# Patient Record
Sex: Female | Born: 1975 | Race: Black or African American | Hispanic: No | Marital: Single | Smoking: Former smoker
Health system: Southern US, Community
[De-identification: ages and names within clinical notes are randomized; demographics above are authoritative.]

## PROBLEM LIST (undated history)

## (undated) DIAGNOSIS — D649 Anemia, unspecified: Secondary | ICD-10-CM

## (undated) DIAGNOSIS — E039 Hypothyroidism, unspecified: Secondary | ICD-10-CM

## (undated) DIAGNOSIS — M7989 Other specified soft tissue disorders: Secondary | ICD-10-CM

## (undated) DIAGNOSIS — R0600 Dyspnea, unspecified: Secondary | ICD-10-CM

## (undated) DIAGNOSIS — E041 Nontoxic single thyroid nodule: Secondary | ICD-10-CM

## (undated) HISTORY — PX: BIOPSY THYROID: PRO38

## (undated) HISTORY — DX: Nontoxic single thyroid nodule: E04.1

## (undated) HISTORY — DX: Hypothyroidism, unspecified: E03.9

---

## 2008-11-30 ENCOUNTER — Encounter (INDEPENDENT_AMBULATORY_CARE_PROVIDER_SITE_OTHER): Payer: Self-pay | Admitting: *Deleted

## 2009-06-14 ENCOUNTER — Ambulatory Visit (HOSPITAL_COMMUNITY): Admission: RE | Admit: 2009-06-14 | Discharge: 2009-06-14 | Payer: Self-pay | Admitting: Family Medicine

## 2010-05-04 ENCOUNTER — Emergency Department (HOSPITAL_COMMUNITY): Admission: EM | Admit: 2010-05-04 | Discharge: 2010-05-04 | Payer: Self-pay | Admitting: Emergency Medicine

## 2011-03-14 IMAGING — US US SOFT TISSUE HEAD/NECK
1 series · 14 of 25 positions shown · non-contrast
Comparison: None

CLINICAL DATA: Enlarged thyroid on physical exam

THYROID ULTRASOUND
TECHNIQUE: Ultrasound examination of the thyroid gland and
adjacent soft tissues was performed.

[Series 1: us soft tissue head/neck · 0.09mm/px · 14 of 67 slices shown]
[im 1/67]
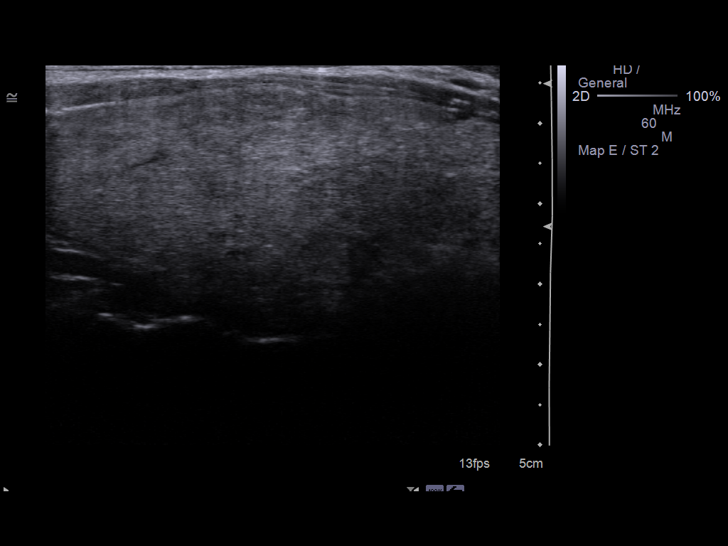
[im 6/67]
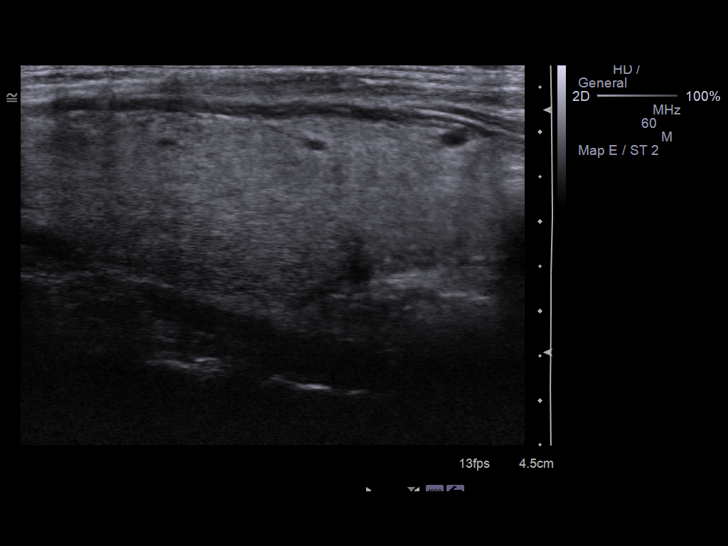
[im 12/67]
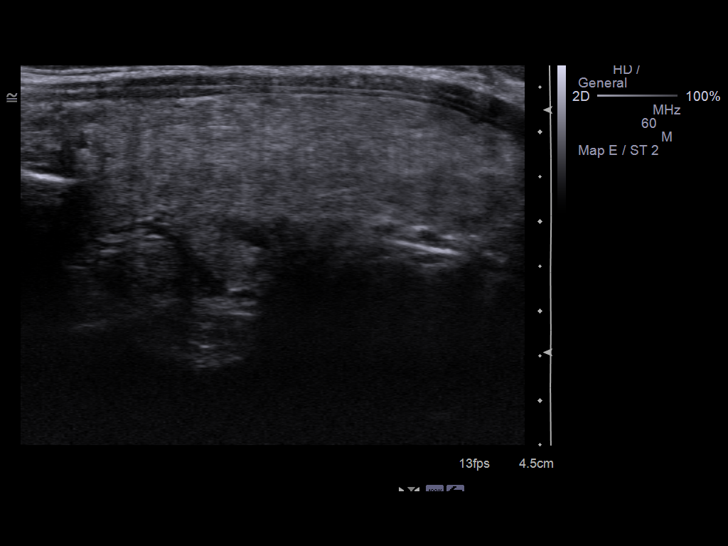
[im 17/67]
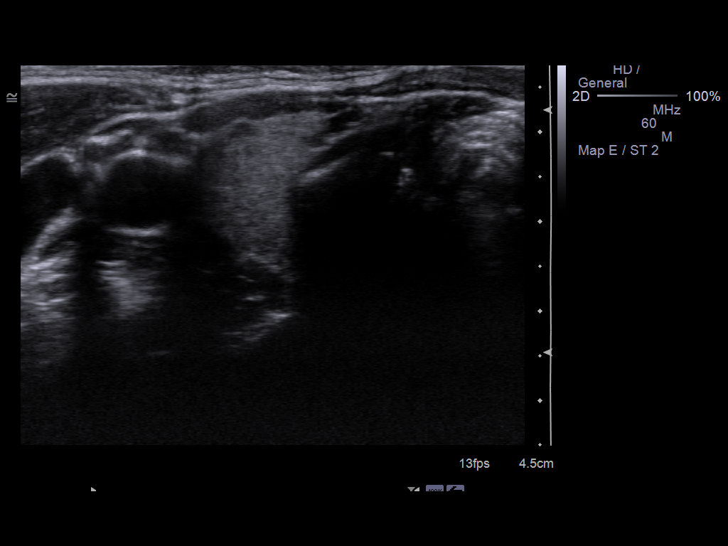
[im 23/67]
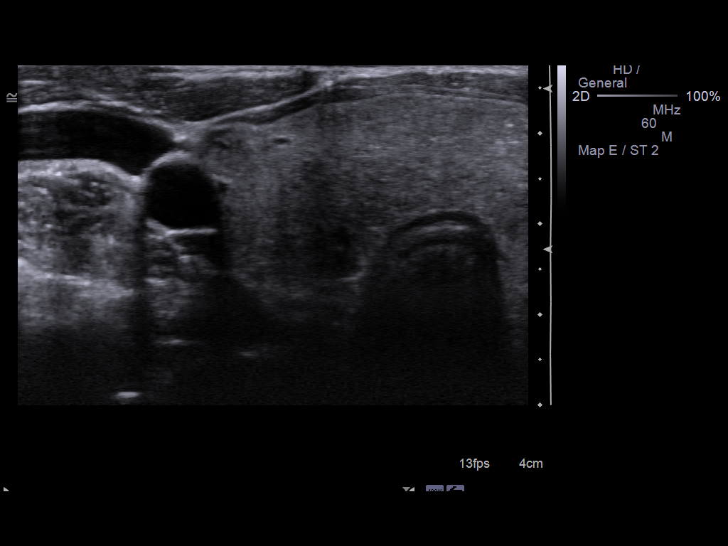
[im 25/67]
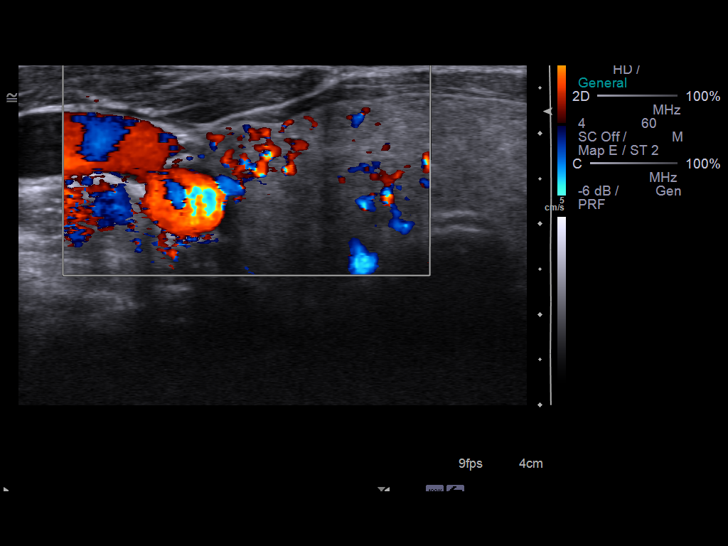
[im 31/67]
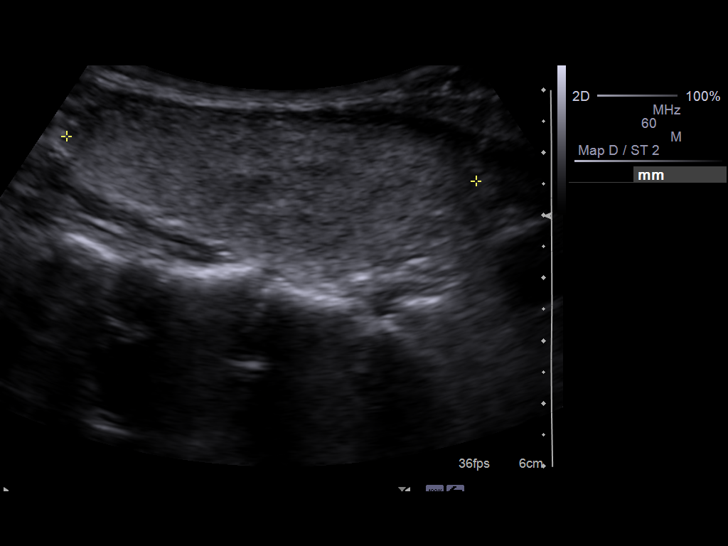
[im 36/67]
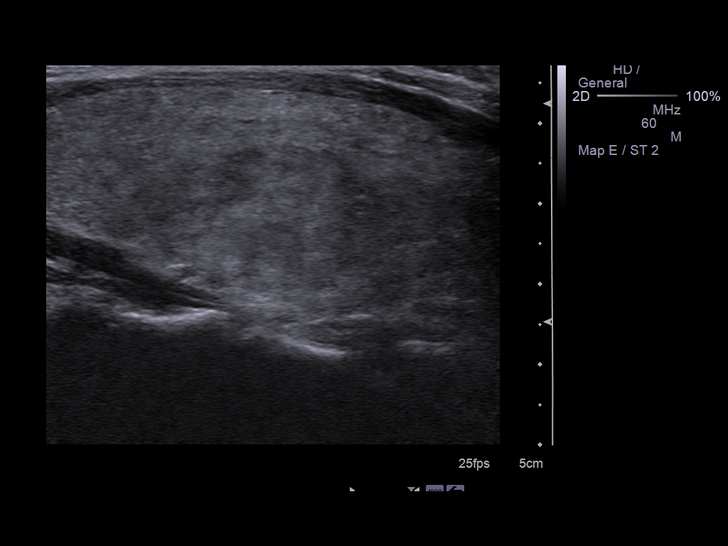
[im 42/67]
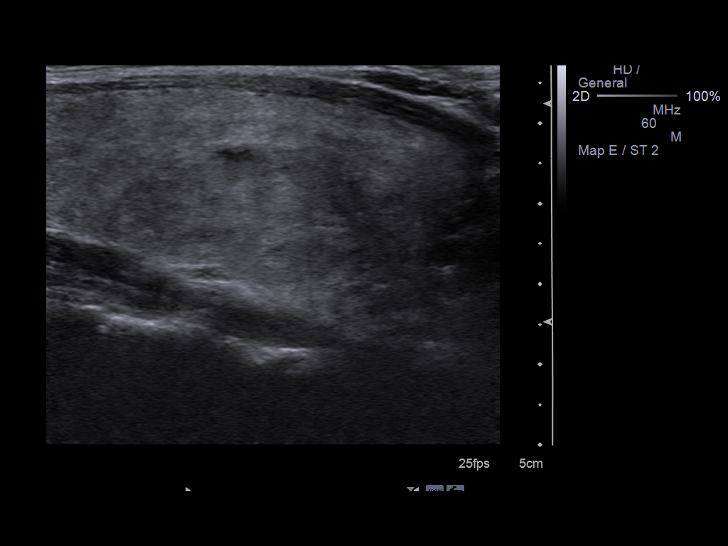
[im 45/67]
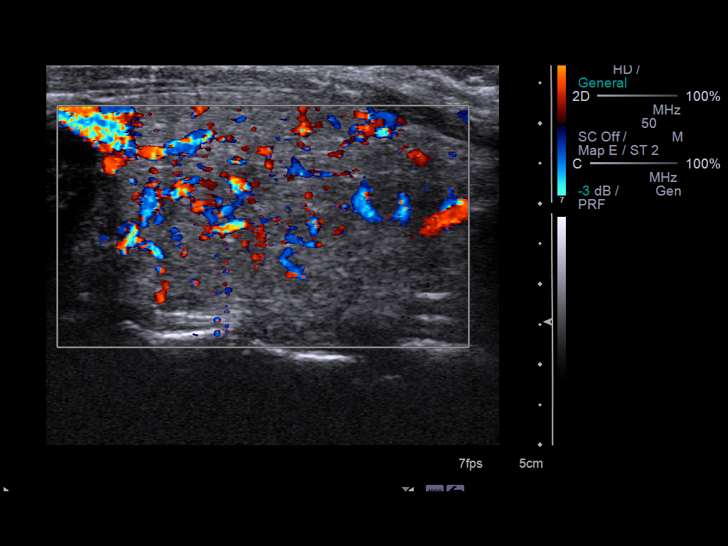
[im 50/67]
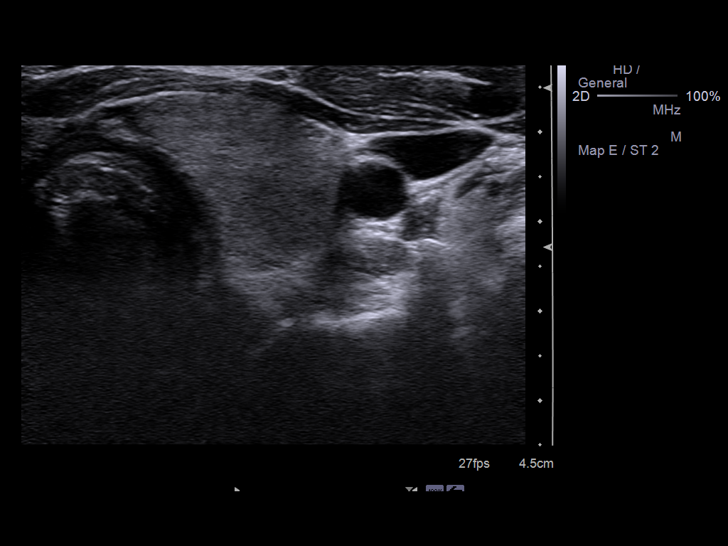
[im 56/67]
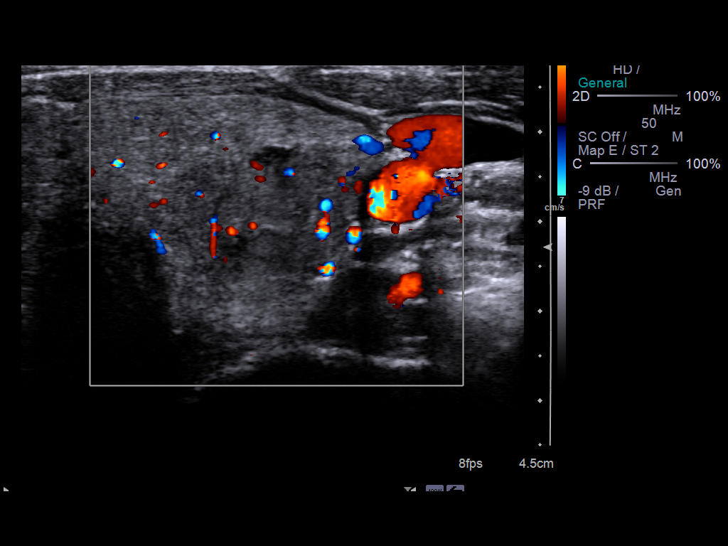
[im 61/67]
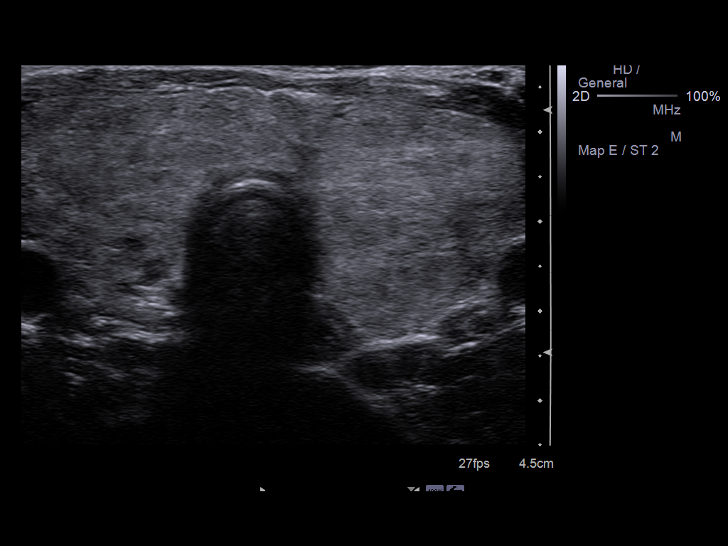
[im 67/67]
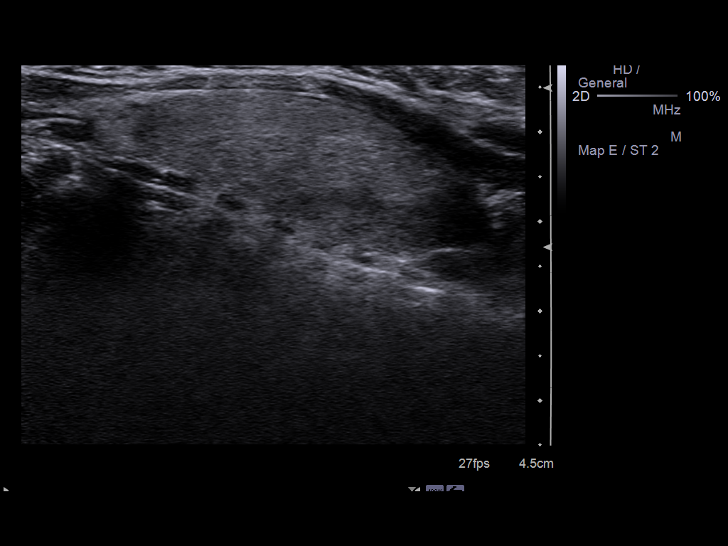

[14 of 25 positions shown; findings below may reference images not displayed]

FINDINGS: The thyroid gland is diffusely enlarged and
inhomogeneous.  The right lobe measures 6.5 cm sagittally, with a
depth of 2.6 cm and width of 2.7 cm.  The left lobe measures 6.6 x
2.7 x 2.7 cm, with the isthmus measuring 9 mm in thickness.

On the left there is a slightly hypoechoic indistinct nodule of
x 1.9 x 1.8 cm.  On the right there is a tiny peripheral nodule of
7 x 8 x 9 mm.  No other thyroid nodule is seen.
IMPRESSION: 1.  Diffusely enlarged and inhomogeneous thyroid.
2.  Indistinct slightly hypoechogenic nodule in the left lobe of
3.4 x 1.9 x 1.8 cm.  Consider biopsy of this dominant nodule.

## 2012-06-28 ENCOUNTER — Encounter: Payer: BC Managed Care – PPO | Admitting: Obstetrics and Gynecology

## 2012-07-13 ENCOUNTER — Encounter: Payer: BC Managed Care – PPO | Admitting: Obstetrics and Gynecology

## 2012-07-16 ENCOUNTER — Encounter: Payer: BC Managed Care – PPO | Admitting: Obstetrics and Gynecology

## 2015-05-25 ENCOUNTER — Encounter: Payer: Self-pay | Admitting: Physician Assistant

## 2015-06-08 ENCOUNTER — Encounter: Payer: Self-pay | Admitting: Gastroenterology

## 2015-06-11 ENCOUNTER — Ambulatory Visit: Payer: Self-pay | Admitting: Physician Assistant

## 2016-07-29 ENCOUNTER — Other Ambulatory Visit (HOSPITAL_COMMUNITY): Payer: Self-pay | Admitting: *Deleted

## 2016-07-30 ENCOUNTER — Ambulatory Visit (HOSPITAL_COMMUNITY)
Admission: RE | Admit: 2016-07-30 | Discharge: 2016-07-30 | Disposition: A | Discharge status: Home / Self Care | Payer: BLUE CROSS/BLUE SHIELD | Source: Ambulatory Visit | Attending: Obstetrics & Gynecology | Admitting: Obstetrics & Gynecology

## 2016-07-30 DIAGNOSIS — N920 Excessive and frequent menstruation with regular cycle: Secondary | ICD-10-CM | POA: Insufficient documentation

## 2016-07-30 DIAGNOSIS — D649 Anemia, unspecified: Secondary | ICD-10-CM | POA: Insufficient documentation

## 2016-07-30 MED ORDER — SODIUM CHLORIDE 0.9 % IV SOLN
510.0000 mg | INTRAVENOUS | Status: DC
  Administered 2016-07-30: 510 mg via INTRAVENOUS
  Filled 2016-07-30: qty 17

## 2016-07-30 NOTE — Discharge Instructions (Signed)
Ferumoxytol injection What is this medicine? FERUMOXYTOL is an iron complex. Iron is used to make healthy red blood cells, which carry oxygen and nutrients throughout the body. This medicine is used to treat iron deficiency anemia in people with chronic kidney disease. COMMON BRAND NAME(S): Feraheme What should I tell my health care provider before I take this medicine? They need to know if you have any of these conditions: -anemia not caused by low iron levels -high levels of iron in the blood -magnetic resonance imaging (MRI) test scheduled -an unusual or allergic reaction to iron, other medicines, foods, dyes, or preservatives -pregnant or trying to get pregnant -breast-feeding How should I use this medicine? This medicine is for injection into a vein. It is given by a health care professional in a hospital or clinic setting. Talk to your pediatrician regarding the use of this medicine in children. Special care may be needed. What if I miss a dose? It is important not to miss your dose. Call your doctor or health care professional if you are unable to keep an appointment. What may interact with this medicine? This medicine may interact with the following medications: -other iron products What should I watch for while using this medicine? Visit your doctor or healthcare professional regularly. Tell your doctor or healthcare professional if your symptoms do not start to get better or if they get worse. You may need blood work done while you are taking this medicine. You may need to follow a special diet. Talk to your doctor. Foods that contain iron include: whole grains/cereals, dried fruits, beans, or peas, leafy green vegetables, and organ meats (liver, kidney). What side effects may I notice from receiving this medicine? Side effects that you should report to your doctor or health care professional as soon as possible: -allergic reactions like skin rash, itching or hives, swelling of the  face, lips, or tongue -breathing problems -changes in blood pressure -feeling faint or lightheaded, falls -fever or chills -flushing, sweating, or hot feelings -swelling of the ankles or feet Side effects that usually do not require medical attention (report to your doctor or health care professional if they continue or are bothersome): -diarrhea -headache -nausea, vomiting -stomach pain Where should I keep my medicine? This drug is given in a hospital or clinic and will not be stored at home.  2017 Elsevier/Gold Standard (2015-07-26 12:41:49)  

## 2016-08-04 ENCOUNTER — Other Ambulatory Visit (HOSPITAL_COMMUNITY): Payer: Self-pay | Admitting: *Deleted

## 2016-08-05 ENCOUNTER — Encounter (HOSPITAL_COMMUNITY): Payer: BLUE CROSS/BLUE SHIELD

## 2016-08-07 ENCOUNTER — Encounter (HOSPITAL_COMMUNITY)
Admission: RE | Admit: 2016-08-07 | Discharge: 2016-08-07 | Disposition: A | Discharge status: Home / Self Care | Payer: BLUE CROSS/BLUE SHIELD | Source: Ambulatory Visit | Attending: Obstetrics & Gynecology | Admitting: Obstetrics & Gynecology

## 2016-08-07 DIAGNOSIS — D259 Leiomyoma of uterus, unspecified: Secondary | ICD-10-CM | POA: Diagnosis not present

## 2016-08-07 DIAGNOSIS — D649 Anemia, unspecified: Secondary | ICD-10-CM | POA: Insufficient documentation

## 2016-08-07 DIAGNOSIS — N920 Excessive and frequent menstruation with regular cycle: Secondary | ICD-10-CM | POA: Diagnosis not present

## 2016-08-07 MED ORDER — SODIUM CHLORIDE 0.9 % IV SOLN
510.0000 mg | INTRAVENOUS | Status: AC
  Administered 2016-08-07: 510 mg via INTRAVENOUS
  Filled 2016-08-07: qty 17

## 2016-10-14 ENCOUNTER — Inpatient Hospital Stay: Admit: 2016-10-14 | Payer: BLUE CROSS/BLUE SHIELD | Admitting: Obstetrics & Gynecology

## 2016-10-14 SURGERY — MYOMECTOMY, ABDOMINAL APPROACH
Anesthesia: Choice

## 2016-11-28 NOTE — Patient Instructions (Signed)
Your procedure is scheduled on:  Monday, December 08, 2016  Enter through the [employer] of [facility] at:  6:00 AM  Pick up the phone at the desk and dial [phone].  Call this number if you have problems the morning of surgery: [phone].  Remember: Do NOT eat food or drink after:  Midnight Sunday  Take these medicines the morning of surgery with a SIP OF WATER:  Synthroid  Stop ALL herbal medications at this time  Do NOT smoke the day of surgery.  Do NOT wear jewelry (body piercing), metal hair clips/bobby pins, make-up, or nail polish. Do NOT wear lotions, powders, or perfumes.  You may wear deodorant. Do NOT shave for 48 hours prior to surgery. Do NOT bring valuables to the hospital. Contacts, dentures, or bridgework may not be worn into surgery.  Leave suitcase in car.  After surgery it may be brought to your room.  For patients admitted to the hospital, checkout time is 11:00 AM the day of discharge.  Bring a copy of your healthcare power of attorney and living will documents.

## 2016-12-02 ENCOUNTER — Encounter (HOSPITAL_COMMUNITY): Payer: Self-pay

## 2016-12-02 ENCOUNTER — Encounter (HOSPITAL_COMMUNITY)
Admission: RE | Admit: 2016-12-02 | Discharge: 2016-12-02 | Disposition: A | Discharge status: Home / Self Care | Payer: BLUE CROSS/BLUE SHIELD | Source: Ambulatory Visit | Attending: Obstetrics & Gynecology | Admitting: Obstetrics & Gynecology

## 2016-12-02 DIAGNOSIS — Z01812 Encounter for preprocedural laboratory examination: Secondary | ICD-10-CM | POA: Diagnosis present

## 2016-12-02 DIAGNOSIS — Z01810 Encounter for preprocedural cardiovascular examination: Secondary | ICD-10-CM | POA: Insufficient documentation

## 2016-12-02 HISTORY — DX: Dyspnea, unspecified: R06.00

## 2016-12-02 HISTORY — DX: Anemia, unspecified: D64.9

## 2016-12-02 HISTORY — DX: Other specified soft tissue disorders: M79.89

## 2016-12-02 LAB — COMPREHENSIVE METABOLIC PANEL
ALK PHOS: 63 U/L (ref 38–126)
ALT: 11 U/L — AB (ref 14–54)
ANION GAP: 5 (ref 5–15)
AST: 13 U/L — ABNORMAL LOW (ref 15–41)
Albumin: 3.5 g/dL (ref 3.5–5.0)
BUN: 10 mg/dL (ref 6–20)
CALCIUM: 8.6 mg/dL — AB (ref 8.9–10.3)
CO2: 23 mmol/L (ref 22–32)
CREATININE: 0.77 mg/dL (ref 0.44–1.00)
Chloride: 110 mmol/L (ref 101–111)
Glucose, Bld: 96 mg/dL (ref 65–99)
Potassium: 3.5 mmol/L (ref 3.5–5.1)
SODIUM: 138 mmol/L (ref 135–145)
Total Bilirubin: 0.5 mg/dL (ref 0.3–1.2)
Total Protein: 6.9 g/dL (ref 6.5–8.1)

## 2016-12-02 LAB — CBC
HCT: 25.1 % — ABNORMAL LOW (ref 36.0–46.0)
Hemoglobin: 7.4 g/dL — ABNORMAL LOW (ref 12.0–15.0)
MCH: 25.4 pg — AB (ref 26.0–34.0)
MCHC: 29.5 g/dL — AB (ref 30.0–36.0)
MCV: 86.3 fL (ref 78.0–100.0)
PLATELETS: 277 10*3/uL (ref 150–400)
RBC: 2.91 MIL/uL — ABNORMAL LOW (ref 3.87–5.11)
RDW: 18.2 % — ABNORMAL HIGH (ref 11.5–15.5)
WBC: 5.8 10*3/uL (ref 4.0–10.5)

## 2016-12-02 LAB — ABO/RH: ABO/RH(D): O POS

## 2016-12-02 NOTE — Pre-Procedure Instructions (Signed)
Left a message for Heather at Dr. Lynnette Caffey' office about abnormal CBC.

## 2016-12-02 NOTE — Pre-Procedure Instructions (Signed)
Sandra Maxwell returned called, Sandra Maxwell will receive another iron infusion this week.  Dr. Lynnette Caffey requests CBC drawn day of surgery.

## 2016-12-03 ENCOUNTER — Other Ambulatory Visit (HOSPITAL_COMMUNITY): Payer: Self-pay | Admitting: *Deleted

## 2016-12-04 ENCOUNTER — Encounter (HOSPITAL_COMMUNITY)
Admission: RE | Admit: 2016-12-04 | Discharge: 2016-12-04 | Disposition: A | Discharge status: Home / Self Care | Payer: BLUE CROSS/BLUE SHIELD | Source: Ambulatory Visit | Attending: Obstetrics & Gynecology | Admitting: Obstetrics & Gynecology

## 2016-12-04 DIAGNOSIS — D500 Iron deficiency anemia secondary to blood loss (chronic): Secondary | ICD-10-CM | POA: Diagnosis not present

## 2016-12-04 DIAGNOSIS — D259 Leiomyoma of uterus, unspecified: Secondary | ICD-10-CM | POA: Diagnosis present

## 2016-12-04 MED ORDER — SODIUM CHLORIDE 0.9 % IV SOLN
510.0000 mg | INTRAVENOUS | Status: DC
  Administered 2016-12-04: 510 mg via INTRAVENOUS
  Filled 2016-12-04: qty 17

## 2016-12-07 ENCOUNTER — Encounter (HOSPITAL_COMMUNITY): Payer: Self-pay | Admitting: Anesthesiology

## 2016-12-07 NOTE — H&P (Signed)
Sandra Maxwell is an 41 y.o. female with enlarged, fibroid uterus causing symptoms; heavy and painful periods with anemia.  Ultrasound shows at least 3 fibroids; largest 8 cm.  She desires a fertility sparing surgical procedure.    Pertinent Gynecological History: Menses: flow is excessive with use of 10 pads or tampons on heaviest days Bleeding: regular Contraception: abstinence DES exposure: unknown Blood transfusions: none Sexually transmitted diseases: no past history Previous GYN Procedures: none  Last mammogram: normal Date: 2016 Last pap: normal Date: 10/2015 OB History: G0   Menstrual History: Menarche age: n/a Patient's last menstrual period was 11/09/2016 (exact date).    Past Medical History:  Diagnosis Date  . Anemia   . Dyspnea   . Hypothyroidism   . Leg swelling    bilateral  . Thyroid nodule, cold     Past Surgical History:  Procedure Laterality Date  . BIOPSY THYROID      History reviewed. No pertinent family history.  Social History:  reports that she quit smoking about 21 years ago. Her smoking use included Cigarettes. She has never used smokeless tobacco. She reports that she does not drink alcohol or use drugs.  Allergies:  Allergies  Allergen Reactions  . Shellfish-Derived Products Itching  . Methimazole Rash    No prescriptions prior to admission.    ROS  Last menstrual period 11/09/2016. Physical Exam  Constitutional: She is oriented to person, place, and time. She appears well-developed and well-nourished.  Cardiovascular: Normal rate and regular rhythm.   Respiratory: Effort normal and breath sounds normal.  GI: Soft. There is no rebound and no guarding.  Neurological: She is alert and oriented to person, place, and time.  Skin: Skin is warm and dry.    No results found for this or any previous visit (from the past 24 hour(s)).  No results found.  Assessment/Plan: 41yo G0 with symptomatic uterine leiomyomata -Abdominal  myomectomy The patient has been counseled re: risk of bleeding, infection, scarring and damage to surrounding structures.  She understands the likelihood of requiring a blood transfusion given her anemia and surgical blood loss.  She also understands the possible need to perform hysterectomy if unable to complete myomectomy safely.  The patient understands that in future pregnancies, she will likely require a C/S at 37 weeks.  All questions were answered and the patient wishes to proceed.  Dalonte Hardage, [place] 12/07/2016, 9:42 PM

## 2016-12-07 NOTE — Anesthesia Preprocedure Evaluation (Addendum)
Anesthesia Evaluation  Patient identified by MRN, date of birth, ID band Patient awake    Reviewed: Allergy & Precautions, NPO status , Patient's Chart, lab work & pertinent test results  Airway Mallampati: III       Dental  (+) Teeth Intact, Dental Advisory Given   Pulmonary neg pulmonary ROS, former smoker,    breath sounds clear to auscultation       Cardiovascular negative cardio ROS   Rhythm:Regular Rate:Normal     Neuro/Psych negative neurological ROS  negative psych ROS   GI/Hepatic negative GI ROS, Neg liver ROS,   Endo/Other  Hypothyroidism   Renal/GU negative Renal ROS  negative genitourinary   Musculoskeletal negative musculoskeletal ROS (+)   Abdominal   Peds negative pediatric ROS (+)  Hematology negative hematology ROS (+)   Anesthesia Other Findings Day of surgery medications reviewed with the patient.  Reproductive/Obstetrics negative OB ROS                            Anesthesia Physical Anesthesia Plan  ASA: II  Anesthesia Plan: General   Post-op Pain Management:    Induction: Intravenous  Airway Management Planned: Oral ETT  Additional Equipment:   Intra-op Plan:   Post-operative Plan: Extubation in OR  Informed Consent: I have reviewed the patients History and Physical, chart, labs and discussed the procedure including the risks, benefits and alternatives for the proposed anesthesia with the patient or authorized representative who has indicated his/her understanding and acceptance.   Dental advisory given  Plan Discussed with: CRNA  Anesthesia Plan Comments: (2nd IV after induction. )       Anesthesia Quick Evaluation

## 2016-12-08 ENCOUNTER — Encounter (HOSPITAL_COMMUNITY): Payer: Self-pay

## 2016-12-08 ENCOUNTER — Encounter (HOSPITAL_COMMUNITY)
Admission: RE | Disposition: A | Discharge status: Home / Self Care | Payer: Self-pay | Source: Ambulatory Visit | Attending: Obstetrics & Gynecology

## 2016-12-08 ENCOUNTER — Inpatient Hospital Stay (HOSPITAL_COMMUNITY): Payer: BLUE CROSS/BLUE SHIELD | Admitting: Anesthesiology

## 2016-12-08 ENCOUNTER — Observation Stay (HOSPITAL_COMMUNITY)
Admission: RE | Admit: 2016-12-08 | Discharge: 2016-12-09 | Disposition: A | Discharge status: Home / Self Care | Payer: BLUE CROSS/BLUE SHIELD | Source: Ambulatory Visit | Attending: Obstetrics & Gynecology | Admitting: Obstetrics & Gynecology

## 2016-12-08 DIAGNOSIS — Z9889 Other specified postprocedural states: Secondary | ICD-10-CM

## 2016-12-08 DIAGNOSIS — D259 Leiomyoma of uterus, unspecified: Secondary | ICD-10-CM | POA: Diagnosis not present

## 2016-12-08 DIAGNOSIS — E039 Hypothyroidism, unspecified: Secondary | ICD-10-CM | POA: Diagnosis not present

## 2016-12-08 DIAGNOSIS — Z87891 Personal history of nicotine dependence: Secondary | ICD-10-CM | POA: Insufficient documentation

## 2016-12-08 DIAGNOSIS — D649 Anemia, unspecified: Secondary | ICD-10-CM | POA: Insufficient documentation

## 2016-12-08 HISTORY — PX: MYOMECTOMY: SHX85

## 2016-12-08 LAB — CBC
HCT: 28.7 % — ABNORMAL LOW (ref 36.0–46.0)
Hemoglobin: 8.7 g/dL — ABNORMAL LOW (ref 12.0–15.0)
MCH: 26.7 pg (ref 26.0–34.0)
MCHC: 30.3 g/dL (ref 30.0–36.0)
MCV: 88 fL (ref 78.0–100.0)
PLATELETS: 288 10*3/uL (ref 150–400)
RBC: 3.26 MIL/uL — ABNORMAL LOW (ref 3.87–5.11)
RDW: 21.5 % — AB (ref 11.5–15.5)
WBC: 6.6 10*3/uL (ref 4.0–10.5)

## 2016-12-08 LAB — HEMOGLOBIN AND HEMATOCRIT, BLOOD
HEMATOCRIT: 27.9 % — AB (ref 36.0–46.0)
HEMOGLOBIN: 9 g/dL — AB (ref 12.0–15.0)

## 2016-12-08 LAB — PREGNANCY, URINE: Preg Test, Ur: NEGATIVE

## 2016-12-08 LAB — PREPARE RBC (CROSSMATCH)

## 2016-12-08 SURGERY — MYOMECTOMY, ABDOMINAL APPROACH
Anesthesia: General | Site: Abdomen

## 2016-12-08 MED ORDER — SUGAMMADEX SODIUM 200 MG/2ML IV SOLN
INTRAVENOUS | Status: AC
  Filled 2016-12-08: qty 2

## 2016-12-08 MED ORDER — VASOPRESSIN 20 UNIT/ML IV SOLN
INTRAVENOUS | Status: AC
  Filled 2016-12-08: qty 1

## 2016-12-08 MED ORDER — HYDROMORPHONE HCL 1 MG/ML IJ SOLN
INTRAMUSCULAR | Status: AC
  Administered 2016-12-08: 0.5 mg via INTRAVENOUS
  Filled 2016-12-08: qty 1

## 2016-12-08 MED ORDER — MENTHOL 3 MG MT LOZG: 1.0000 | LOZENGE | OROMUCOSAL | Status: DC | PRN

## 2016-12-08 MED ORDER — PROPOFOL 10 MG/ML IV BOLUS
INTRAVENOUS | Status: AC
Start: 2016-12-08 — End: 2016-12-08
  Filled 2016-12-08: qty 20

## 2016-12-08 MED ORDER — HYDROMORPHONE HCL 1 MG/ML IJ SOLN
0.2500 mg | INTRAMUSCULAR | Status: DC | PRN
  Administered 2016-12-08 (×2): 0.25 mg via INTRAVENOUS
  Administered 2016-12-08 (×3): 0.5 mg via INTRAVENOUS

## 2016-12-08 MED ORDER — LACTATED RINGERS IV SOLN
INTRAVENOUS | Status: DC
  Administered 2016-12-08 (×2): via INTRAVENOUS

## 2016-12-08 MED ORDER — FENTANYL CITRATE (PF) 250 MCG/5ML IJ SOLN
INTRAMUSCULAR | Status: AC
  Filled 2016-12-08: qty 5

## 2016-12-08 MED ORDER — DOCUSATE SODIUM 100 MG PO CAPS
100.0000 mg | ORAL_CAPSULE | Freq: Two times a day (BID) | ORAL | Status: DC
  Administered 2016-12-08 – 2016-12-09 (×2): 100 mg via ORAL
  Filled 2016-12-08 (×2): qty 1

## 2016-12-08 MED ORDER — KETOROLAC TROMETHAMINE 30 MG/ML IJ SOLN
30.0000 mg | Freq: Four times a day (QID) | INTRAMUSCULAR | Status: DC
  Administered 2016-12-08 – 2016-12-09 (×3): 30 mg via INTRAVENOUS
  Filled 2016-12-08 (×3): qty 1

## 2016-12-08 MED ORDER — DEXAMETHASONE SODIUM PHOSPHATE 10 MG/ML IJ SOLN
INTRAMUSCULAR | Status: DC | PRN
  Administered 2016-12-08: 4 mg via INTRAVENOUS

## 2016-12-08 MED ORDER — SODIUM CHLORIDE 0.9 % IV SOLN
Freq: Once | INTRAVENOUS | Status: AC
  Administered 2016-12-08: 08:00:00 via INTRAVENOUS

## 2016-12-08 MED ORDER — 0.9 % SODIUM CHLORIDE (POUR BTL) OPTIME
TOPICAL | Status: DC | PRN
  Administered 2016-12-08: 2000 mL

## 2016-12-08 MED ORDER — DEXAMETHASONE SODIUM PHOSPHATE 4 MG/ML IJ SOLN
INTRAMUSCULAR | Status: AC
  Filled 2016-12-08: qty 1

## 2016-12-08 MED ORDER — PROPOFOL 10 MG/ML IV BOLUS
INTRAVENOUS | Status: DC | PRN
  Administered 2016-12-08: 200 mg via INTRAVENOUS

## 2016-12-08 MED ORDER — ACETAMINOPHEN 10 MG/ML IV SOLN
1000.0000 mg | Freq: Once | INTRAVENOUS | Status: AC
  Administered 2016-12-08: 1000 mg via INTRAVENOUS

## 2016-12-08 MED ORDER — FENTANYL CITRATE (PF) 100 MCG/2ML IJ SOLN
INTRAMUSCULAR | Status: DC | PRN
  Administered 2016-12-08 (×2): 100 ug via INTRAVENOUS
  Administered 2016-12-08 (×3): 50 ug via INTRAVENOUS

## 2016-12-08 MED ORDER — PROMETHAZINE HCL 25 MG/ML IJ SOLN: 6.2500 mg | INTRAMUSCULAR | Status: DC | PRN

## 2016-12-08 MED ORDER — SODIUM CHLORIDE 0.9 % IJ SOLN
INTRAMUSCULAR | Status: AC
  Filled 2016-12-08: qty 100

## 2016-12-08 MED ORDER — KETOROLAC TROMETHAMINE 30 MG/ML IJ SOLN
INTRAMUSCULAR | Status: AC
  Filled 2016-12-08: qty 1

## 2016-12-08 MED ORDER — KETOROLAC TROMETHAMINE 30 MG/ML IJ SOLN
30.0000 mg | Freq: Four times a day (QID) | INTRAMUSCULAR | Status: DC
  Filled 2016-12-08: qty 1

## 2016-12-08 MED ORDER — LIDOCAINE HCL (CARDIAC) 20 MG/ML IV SOLN
INTRAVENOUS | Status: DC | PRN
  Administered 2016-12-08: 100 mg via INTRAVENOUS

## 2016-12-08 MED ORDER — LEVOTHYROXINE SODIUM 150 MCG PO TABS
150.0000 ug | ORAL_TABLET | Freq: Every day | ORAL | Status: DC
  Administered 2016-12-09: 150 ug via ORAL
  Filled 2016-12-08 (×2): qty 1

## 2016-12-08 MED ORDER — LACTATED RINGERS IV SOLN: INTRAVENOUS | Status: DC

## 2016-12-08 MED ORDER — ONDANSETRON HCL 4 MG/2ML IJ SOLN: 4.0000 mg | Freq: Four times a day (QID) | INTRAMUSCULAR | Status: DC | PRN

## 2016-12-08 MED ORDER — VASOPRESSIN 20 UNIT/ML IJ SOLN
INTRAMUSCULAR | Status: DC | PRN
  Administered 2016-12-08: 20 [IU]

## 2016-12-08 MED ORDER — MIDAZOLAM HCL 2 MG/2ML IJ SOLN
INTRAMUSCULAR | Status: AC
  Filled 2016-12-08: qty 2

## 2016-12-08 MED ORDER — SCOPOLAMINE 1 MG/3DAYS TD PT72
MEDICATED_PATCH | TRANSDERMAL | Status: AC
  Administered 2016-12-08: 1.5 mg via TRANSDERMAL
  Filled 2016-12-08: qty 1

## 2016-12-08 MED ORDER — ROCURONIUM BROMIDE 100 MG/10ML IV SOLN
INTRAVENOUS | Status: AC
  Filled 2016-12-08: qty 1

## 2016-12-08 MED ORDER — ROCURONIUM BROMIDE 100 MG/10ML IV SOLN
INTRAVENOUS | Status: DC | PRN
  Administered 2016-12-08: 50 mg via INTRAVENOUS

## 2016-12-08 MED ORDER — MEPERIDINE HCL 25 MG/ML IJ SOLN: 6.2500 mg | INTRAMUSCULAR | Status: DC | PRN

## 2016-12-08 MED ORDER — ACETAMINOPHEN 10 MG/ML IV SOLN
INTRAVENOUS | Status: AC
  Filled 2016-12-08: qty 100

## 2016-12-08 MED ORDER — KETOROLAC TROMETHAMINE 30 MG/ML IJ SOLN
INTRAMUSCULAR | Status: DC | PRN
  Administered 2016-12-08: 30 mg via INTRAVENOUS

## 2016-12-08 MED ORDER — SIMETHICONE 80 MG PO CHEW: 80.0000 mg | CHEWABLE_TABLET | Freq: Four times a day (QID) | ORAL | Status: DC | PRN

## 2016-12-08 MED ORDER — OXYCODONE-ACETAMINOPHEN 5-325 MG PO TABS
1.0000 | ORAL_TABLET | ORAL | Status: DC | PRN
  Administered 2016-12-09 (×2): 1 via ORAL
  Filled 2016-12-08 (×2): qty 1

## 2016-12-08 MED ORDER — SCOPOLAMINE 1 MG/3DAYS TD PT72
1.0000 | MEDICATED_PATCH | Freq: Once | TRANSDERMAL | Status: DC
  Administered 2016-12-08: 1.5 mg via TRANSDERMAL

## 2016-12-08 MED ORDER — HYDROMORPHONE HCL 1 MG/ML IJ SOLN
0.2000 mg | INTRAMUSCULAR | Status: DC | PRN
  Administered 2016-12-08 (×2): 0.6 mg via INTRAVENOUS
  Filled 2016-12-08 (×2): qty 1

## 2016-12-08 MED ORDER — LACTATED RINGERS IV SOLN
INTRAVENOUS | Status: DC
  Administered 2016-12-08 (×3): via INTRAVENOUS

## 2016-12-08 MED ORDER — MIDAZOLAM HCL 2 MG/2ML IJ SOLN
INTRAMUSCULAR | Status: DC | PRN
  Administered 2016-12-08: 2 mg via INTRAVENOUS

## 2016-12-08 MED ORDER — ONDANSETRON HCL 4 MG/2ML IJ SOLN
INTRAMUSCULAR | Status: DC | PRN
  Administered 2016-12-08: 4 mg via INTRAVENOUS

## 2016-12-08 MED ORDER — LIDOCAINE HCL 1 % IJ SOLN
INTRAMUSCULAR | Status: AC
  Filled 2016-12-08: qty 10

## 2016-12-08 MED ORDER — ONDANSETRON HCL 4 MG PO TABS: 4.0000 mg | ORAL_TABLET | Freq: Four times a day (QID) | ORAL | Status: DC | PRN

## 2016-12-08 MED ORDER — SUGAMMADEX SODIUM 200 MG/2ML IV SOLN
INTRAVENOUS | Status: DC | PRN
  Administered 2016-12-08: 197.8 mg via INTRAVENOUS

## 2016-12-08 MED ORDER — HYDROMORPHONE HCL 1 MG/ML IJ SOLN
INTRAMUSCULAR | Status: AC
  Administered 2016-12-08: 0.25 mg via INTRAVENOUS
  Filled 2016-12-08: qty 1

## 2016-12-08 MED ORDER — ONDANSETRON HCL 4 MG/2ML IJ SOLN
INTRAMUSCULAR | Status: AC
  Filled 2016-12-08: qty 2

## 2016-12-08 MED ORDER — LIDOCAINE HCL (CARDIAC) 20 MG/ML IV SOLN
INTRAVENOUS | Status: AC
  Filled 2016-12-08: qty 5

## 2016-12-08 MED ORDER — CEFAZOLIN SODIUM-DEXTROSE 2-4 GM/100ML-% IV SOLN
2.0000 g | INTRAVENOUS | Status: AC
  Administered 2016-12-08: 2 g via INTRAVENOUS

## 2016-12-08 SURGICAL SUPPLY — 35 items
BARRIER ADHS 3X4 INTERCEED (GAUZE/BANDAGES/DRESSINGS) IMPLANT
BENZOIN TINCTURE PRP APPL 2/3 (GAUZE/BANDAGES/DRESSINGS) ×3 IMPLANT
CANISTER SUCT 3000ML PPV (MISCELLANEOUS) ×3 IMPLANT
CLOSURE WOUND 1/2 X4 (GAUZE/BANDAGES/DRESSINGS) ×1
CLOTH BEACON ORANGE TIMEOUT ST (SAFETY) ×3 IMPLANT
CONT PATH 16OZ SNAP LID 3702 (MISCELLANEOUS) ×3 IMPLANT
DRAPE CESAREAN BIRTH W POUCH (DRAPES) ×3 IMPLANT
DURAPREP 26ML APPLICATOR (WOUND CARE) ×6 IMPLANT
GLOVE BIO SURGEON STRL SZ 6 (GLOVE) ×3 IMPLANT
GLOVE BIOGEL PI IND STRL 6 (GLOVE) ×2 IMPLANT
GLOVE BIOGEL PI IND STRL 7.0 (GLOVE) ×1 IMPLANT
GLOVE BIOGEL PI INDICATOR 6 (GLOVE) ×4
GLOVE BIOGEL PI INDICATOR 7.0 (GLOVE) ×2
GOWN STRL REUS W/TWL LRG LVL3 (GOWN DISPOSABLE) ×9 IMPLANT
NS IRRIG 1000ML POUR BTL (IV SOLUTION) ×3 IMPLANT
PACK ABDOMINAL GYN (CUSTOM PROCEDURE TRAY) ×3 IMPLANT
PAD OB MATERNITY 4.3X12.25 (PERSONAL CARE ITEMS) ×3 IMPLANT
PENCIL SMOKE EVAC W/HOLSTER (ELECTROSURGICAL) IMPLANT
PROTECTOR NERVE ULNAR (MISCELLANEOUS) ×3 IMPLANT
SPONGE LAP 18X18 X RAY DECT (DISPOSABLE) ×3 IMPLANT
STRIP CLOSURE SKIN 1/2X4 (GAUZE/BANDAGES/DRESSINGS) ×2 IMPLANT
SUT MON AB 2-0 CT1 36 (SUTURE) ×3 IMPLANT
SUT PDS AB 0 CT1 27 (SUTURE) IMPLANT
SUT PDS AB 3-0 SH 27 (SUTURE) ×9 IMPLANT
SUT PLAIN 2 0 (SUTURE) ×2
SUT PLAIN 2 0 XLH (SUTURE) IMPLANT
SUT PLAIN ABS 2-0 CT1 27XMFL (SUTURE) ×1 IMPLANT
SUT VIC AB 0 CT1 18XCR BRD8 (SUTURE) ×3 IMPLANT
SUT VIC AB 0 CT1 36 (SUTURE) ×3 IMPLANT
SUT VIC AB 0 CT1 8-18 (SUTURE) ×6
SUT VIC AB 2-0 CT1 (SUTURE) IMPLANT
SUT VIC AB 4-0 KS 27 (SUTURE) ×3 IMPLANT
SUT VICRYL 0 TIES 12 18 (SUTURE) ×3 IMPLANT
TOWEL OR 17X24 6PK STRL BLUE (TOWEL DISPOSABLE) ×6 IMPLANT
TRAY FOLEY CATH SILVER 14FR (SET/KITS/TRAYS/PACK) ×3 IMPLANT

## 2016-12-08 NOTE — Progress Notes (Signed)
No change in H&P. Hgb this AM 8.7.  Will T&C x 2 u PRBCs.  Linda Hedges, DO

## 2016-12-08 NOTE — Anesthesia Postprocedure Evaluation (Signed)
Anesthesia Post Note  Patient: Thea Silversmith  Procedure(s) Performed: Procedure(s) (LRB): MYOMECTOMY, ABDOMINAL (N/A)     Patient location during evaluation: PACU Anesthesia Type: General Level of consciousness: awake and alert Pain management: pain level controlled Vital Signs Assessment: post-procedure vital signs reviewed and stable Respiratory status: spontaneous breathing, nonlabored ventilation, respiratory function stable and patient connected to nasal cannula oxygen Cardiovascular status: blood pressure returned to baseline and stable Postop Assessment: no signs of nausea or vomiting Anesthetic complications: no    Last Vitals:  Vitals:   12/08/16 1145 12/08/16 1611  BP: 116/73 115/64  Pulse: 75 62  Resp: 16 18  Temp: 36.8 C 36.6 C    Last Pain:  Vitals:   12/08/16 1611  TempSrc: Oral  PainSc:    Pain Goal: Patients Stated Pain Goal: 5 (12/08/16 1136)               Effie Berkshire

## 2016-12-08 NOTE — Transfer of Care (Signed)
Immediate Anesthesia Transfer of Care Note  Patient: Sandra Maxwell  Procedure(s) Performed: Procedure(s): MYOMECTOMY, ABDOMINAL (N/A)  Patient Location: PACU  Anesthesia Type:General  Level of Consciousness: awake, alert , oriented and patient cooperative  Airway & Oxygen Therapy: Patient Spontanous Breathing and Patient connected to nasal cannula oxygen  Post-op Assessment: Report given to RN and Post -op Vital signs reviewed and stable  Post vital signs: Reviewed and stable 98.9 oral temp, 129/88, NSR 81, spo2 on 2L [place] 100%  Last Vitals:  Vitals:   12/08/16 0610  BP: (!) 128/92  Pulse: 100  Resp: 20  Temp: 36.8 C    Last Pain:  Vitals:   12/08/16 0610  TempSrc: Oral      Patients Stated Pain Goal: 5 (09/04/29 4388)  Complications: No apparent anesthesia complications

## 2016-12-08 NOTE — Progress Notes (Addendum)
NOS  No current c/o.  Tolerating clears.  No CP/SOB.  Pain and nausea well controlled.  VSS. AF. Hgb at noon 9.0 (s/p 2 u PRBCs) UOP adequate and clear  Gen: A&O x 3 Abd: soft, ND, dressing with quarter sized stain Ext: no c/c/e  POD#0 s/p abdominal myomectomy -Acute on chronic anemia-s/p 2uPRBCs -Continue pain and nausea control -AM labs pending -D/C foley and IVF tomorrow -Ambulate -K pad  Linda Hedges, DO

## 2016-12-08 NOTE — Anesthesia Procedure Notes (Signed)
Procedure Name: Intubation Date/Time: 12/08/2016 7:25 AM Performed by: Hewitt Blade Pre-anesthesia Checklist: Patient identified, Emergency Drugs available, Suction available and Patient being monitored Patient Re-evaluated:Patient Re-evaluated prior to inductionOxygen Delivery Method: Circle system utilized Preoxygenation: Pre-oxygenation with 100% oxygen Intubation Type: IV induction Ventilation: Mask ventilation without difficulty Laryngoscope Size: Mac and 3 Grade View: Grade I Tube type: Oral Tube size: 7.0 mm Number of attempts: 1 Airway Equipment and Method: Stylet Placement Confirmation: ETT inserted through vocal cords under direct vision,  positive ETCO2 and breath sounds checked- equal and bilateral Secured at: 21 cm Tube secured with: Tape Dental Injury: Teeth and Oropharynx as per pre-operative assessment

## 2016-12-09 ENCOUNTER — Encounter (HOSPITAL_COMMUNITY): Payer: Self-pay | Admitting: Obstetrics & Gynecology

## 2016-12-09 DIAGNOSIS — D259 Leiomyoma of uterus, unspecified: Secondary | ICD-10-CM | POA: Diagnosis not present

## 2016-12-09 LAB — TYPE AND SCREEN
ABO/RH(D): O POS
ANTIBODY SCREEN: NEGATIVE
UNIT DIVISION: 0
Unit division: 0

## 2016-12-09 LAB — BPAM RBC
BLOOD PRODUCT EXPIRATION DATE: 201806202359
Blood Product Expiration Date: 201806252359
ISSUE DATE / TIME: 201806040742
ISSUE DATE / TIME: 201806040742
UNIT TYPE AND RH: 5100
Unit Type and Rh: 5100

## 2016-12-09 LAB — CBC
HCT: 22.7 % — ABNORMAL LOW (ref 36.0–46.0)
Hemoglobin: 7.2 g/dL — ABNORMAL LOW (ref 12.0–15.0)
MCH: 28.3 pg (ref 26.0–34.0)
MCHC: 31.7 g/dL (ref 30.0–36.0)
MCV: 89.4 fL (ref 78.0–100.0)
PLATELETS: 201 10*3/uL (ref 150–400)
RBC: 2.54 MIL/uL — AB (ref 3.87–5.11)
RDW: 21.5 % — AB (ref 11.5–15.5)
WBC: 10.4 10*3/uL (ref 4.0–10.5)

## 2016-12-09 MED ORDER — OXYCODONE-ACETAMINOPHEN 5-325 MG PO TABS: 1.0000 | ORAL_TABLET | ORAL | 0 refills | Status: AC | PRN

## 2016-12-09 MED ORDER — IBUPROFEN 800 MG PO TABS: 800.0000 mg | ORAL_TABLET | Freq: Four times a day (QID) | ORAL | 1 refills | Status: AC | PRN

## 2016-12-09 NOTE — Discharge Instructions (Signed)
Call MD for T>100.4, heavy vaginal bleeding, severe abdominal pain, intractable nausea and/or vomiting, or respiratory distress.  Call office to schedule postop appointment in 1-2 weeks.  No driving while taking narcotics.  Pelvic rest x 6 weeks. °

## 2016-12-09 NOTE — Progress Notes (Signed)
No current c/o.  Voiding well.  Large BM today.  Wants d/c home.   Patient is meeting all goals.  Linda Hedges, DO

## 2016-12-09 NOTE — Progress Notes (Signed)
Subjective: Patient reports tolerating PO and + flatus. Ambulating well.  No void since catheter removed.  No N/V.  Pain well controlled.   Objective: I have reviewed patient's vital signs, intake and output, medications and labs.  General: alert, cooperative and appears stated age GI: incision: clean and dry and approp ttp Extremities: extremities normal, atraumatic, no cyanosis or edema   Assessment/Plan: POD#1 Doing well; meeting goals Will reassess after lunch to see if voiding without difficulty and consider d/c this pm vs tomorrow am  LOS: 1 day    Madge Therrien 12/09/2016, 8:35 AM

## 2016-12-09 NOTE — Op Note (Signed)
Sandra Maxwell PROCEDURE DATE: 12/08/2016  PREOPERATIVE DIAGNOSIS:  Symptomatic fibroids POSTOPERATIVE DIAGNOSIS:  Symptomatic fibroids SURGEON:   Dr. Linda Hedges ASSISTANT:   Dr. Arvella Nigh OPERATION:  Abdominal myomectomy ANESTHESIA:  General endotracheal.  INDICATIONS: The patient is a 41 y.o. G0 with history of symptomatic uterine fibroids specifically heavy menstrual bleeding to anemia. The patient made a decision to undergo definite surgical treatment. On the preoperative visit, the risks, benefits, indications, and alternatives of the procedure were reviewed with the patient.  On the day of surgery, the risks of surgery were again discussed with the patient including but not limited to: bleeding which may require transfusion or reoperation; infection which may require antibiotics; injury to bowel, bladder, ureters or other surrounding organs; need for additional procedures; thromboembolic phenomenon, incisional problems and other postoperative/anesthesia complications. Written informed consent was obtained.    OPERATIVE FINDINGS: An 18 week sized globally enlarged uterus with normal tubes and ovaries bilaterally. Uterine cavity was entered with removal of the largest fibroid and the patient will require cesarean if she should become pregnant.  ESTIMATED BLOOD LOSS: 1300 ml SPECIMENS:  Leiomyomata sent to pathology COMPLICATIONS:  None immediate.   DESCRIPTION OF PROCEDURE: The patient received intravenous antibiotics and had sequential compression devices applied to her lower extremities while in the preoperative area.   She was taken to the operating room and placed under general anesthesia without difficulty and found to be adequate.The abdomen and perineum were prepped and draped in a sterile manner, and a Foley catheter was inserted into the bladder and attached to constant drainage. After an adequate timeout was performed, a Pfannensteil skin incision was made. This incision was taken  down to the fascia using electrocautery with care given to maintain good hemostasis. The fascia was incised in the midline and the fascial incision was then extended bilaterally using electrocautery without difficulty. The fascia was then dissected off the underlying rectus muscles using blunt and sharp dissection. The rectus muscles were split bluntly in the midline and the peritoneum entered sharply without complication. This peritoneal incision was then extended superiorly and inferiorly with care given to prevent bowel or bladder injury. Upon entry into the abdominal cavity, the upper abdomen was inspected and found to be normal. Attention was then turned to the pelvis. The  Uterus was elevated out of the pelvic.  The uterus was globally enlarged with central firmness suggesting a deeply located, large fibroid.  A single subserosal fibroid was noted posterior, right.  A small incision was made with the Bovie knife and this approximately 2 cm fibroid was removed.  A single layer of deep 2-0 vicryl figure of eight stitches was used to close muscular layer and then 3-0 monocryl was used to close the serosa in a running fashion. Attention was turned to removal of the largest centrally located fibroid.  Bovie knife was used make anterior to posterior incision at the uterine fundus.  At this point, brisk bleeding was noted and given the patient's preoperative hemoglobin (8.7) and the expectation for additional bleeding, the decision was made to transfuse the 2u PRBCs which were already in the OR.  The large fibroid was located, dissected free from surrounding tissues and removed; measuring approximately 8 cm and 50% intracavitary.  Following removal, attempt was made to reapproximate endometrium in the deepest layer but because of the extensive involvement with the fibroid, complete endometrial repair was not possible.  The myometrium was closed in four layers using 2-0 vicryl figure of eight stitches in the  deep  layers and 3-0 monocryl in a running locked stitch superficially.  There was one additional palpable fibroid in posterior uterus just above the level of the uterosacrals and midline.  This area was vertically incised and the bilobed fibroid removed.  Two layers of deep 2-0 Vcryl figure of eight stitches and a superficial 3-0 Monocryl running locked layer achieved hemostasis.  The uterus was returned to the pelvis. The pelvis was irrigated and hemostasis was reconfirmed. Arista was applied to the large fundal incision to encourage continued hemostasis.  The peritoneum was closed with 2-0 monocryl, and the fascia was closed with 0 Vicryl in a running fashion. The subcutaneous layer was reapproximated with 2-0 plain gut. The skin was closed with a 4-0 Monocryl subcuticular stitch. Sponge, lap, needle, and instrument counts were correct times two. The patient was taken to the recovery area awake, extubated and in stable condition.

## 2016-12-09 NOTE — Discharge Summary (Signed)
Physician Discharge Summary  Patient ID: Sandra Maxwell MRN: [id number] DOB/AGE: [birth year] 41 y.o.  Admit date: 12/08/2016 Discharge date: 12/09/2016  Admission Diagnoses: Symptomatic uterine leiomyomata  Discharge Diagnoses: SAA Active Problems:   S/P myomectomy   Discharged Condition: good  Hospital Course: The patient was admitted for anticipated abdominal myomectomy which was performed without complication.  On POD#1 she was meeting all goals and was discharged home with planned office follow up.  Consults: None  Significant Diagnostic Studies: labs: Hgb 7.2  Treatments: surgery: abdominal myomectomy  Discharge Exam: Blood pressure 120/70, pulse 72, temperature 98.7 F (37.1 C), temperature source Oral, resp. rate 16, last menstrual period 11/09/2016, SpO2 100 %. General appearance: alert, cooperative and appears stated age GI: approp ttp Extremities: extremities normal, atraumatic, no cyanosis or edema Incision/Wound: c/d/i   Disposition: Final discharge disposition not confirmed   Allergies as of 12/09/2016      Reactions   Shellfish-derived Products Itching   Methimazole Rash      Medication List    TAKE these medications   ferrous sulfate 325 (65 FE) MG tablet Take 325 mg by mouth 3 (three) times daily with meals.   ibuprofen 800 MG tablet Commonly known as:  ADVIL Take 1 tablet (800 mg total) by mouth every 6 (six) hours as needed. What changed:  medication strength  when to take this  reasons to take this   oxyCODONE-acetaminophen 5-325 MG tablet Commonly known as:  PERCOCET/ROXICET Take 1-2 tablets by mouth every 4 (four) hours as needed (moderate to severe pain (when tolerating fluids)).   SYNTHROID 150 MCG tablet Generic drug:  levothyroxine Take 150 mcg by mouth daily before breakfast.        Signed: Treyce Spillers 12/09/2016, 1:35 PM

## 2019-04-30 ENCOUNTER — Other Ambulatory Visit: Payer: Self-pay

## 2019-04-30 DIAGNOSIS — Z20822 Contact with and (suspected) exposure to covid-19: Secondary | ICD-10-CM

## 2019-05-02 LAB — NOVEL CORONAVIRUS, NAA: SARS-CoV-2, NAA: NOT DETECTED

## 2023-07-28 ENCOUNTER — Emergency Department (HOSPITAL_COMMUNITY)
Admission: EM | Admit: 2023-07-28 | Discharge: 2023-07-28 | Disposition: A | Discharge status: Home / Self Care | Payer: BC Managed Care – PPO | Attending: Emergency Medicine | Admitting: Emergency Medicine

## 2023-07-28 ENCOUNTER — Other Ambulatory Visit: Payer: Self-pay

## 2023-07-28 ENCOUNTER — Encounter (HOSPITAL_COMMUNITY): Payer: Self-pay

## 2023-07-28 DIAGNOSIS — Y92410 Unspecified street and highway as the place of occurrence of the external cause: Secondary | ICD-10-CM | POA: Diagnosis not present

## 2023-07-28 DIAGNOSIS — R519 Headache, unspecified: Secondary | ICD-10-CM | POA: Insufficient documentation

## 2023-07-28 DIAGNOSIS — M542 Cervicalgia: Secondary | ICD-10-CM

## 2023-07-28 MED ORDER — LIDOCAINE 5 % EX PTCH
1.0000 | MEDICATED_PATCH | CUTANEOUS | Status: DC
  Filled 2023-07-28: qty 1

## 2023-07-28 MED ORDER — ACETAMINOPHEN 500 MG PO TABS
1000.0000 mg | ORAL_TABLET | Freq: Once | ORAL | Status: AC
Start: 2023-07-28 — End: 2023-07-28
  Administered 2023-07-28: 1000 mg via ORAL
  Filled 2023-07-28: qty 2

## 2023-07-28 NOTE — Discharge Instructions (Addendum)
Today you declined a CT of your neck, if you have a cervical fracture you are at risked for paralysis, nerve damage, chronic pain, or impaired breathing, we did not definitively rule this out today d/t you declining a CT. Please return to the ER if you feel like your symptoms are worsening.  You can take Tylenol, for your pain control.  And use things such as Tiger balm, or Biofreeze to help with your pain.
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
## 2023-08-27 ENCOUNTER — Other Ambulatory Visit: Payer: Self-pay | Admitting: Family Medicine

## 2023-08-27 DIAGNOSIS — Z0000 Encounter for general adult medical examination without abnormal findings: Secondary | ICD-10-CM

## 2023-09-24 ENCOUNTER — Ambulatory Visit: Payer: BC Managed Care – PPO

## 2023-12-11 ENCOUNTER — Ambulatory Visit
Admission: RE | Admit: 2023-12-11 | Discharge: 2023-12-11 | Disposition: A | Discharge status: Home / Self Care | Source: Ambulatory Visit | Attending: Family Medicine | Admitting: Family Medicine

## 2023-12-11 DIAGNOSIS — Z0000 Encounter for general adult medical examination without abnormal findings: Secondary | ICD-10-CM
